# Patient Record
Sex: Female | Born: 1955 | Race: White | Hispanic: No
Health system: Southern US, Community
[De-identification: ages and names within clinical notes are randomized; demographics above are authoritative.]

## PROBLEM LIST (undated history)

## (undated) DIAGNOSIS — I10 Essential (primary) hypertension: Secondary | ICD-10-CM

## (undated) DIAGNOSIS — K219 Gastro-esophageal reflux disease without esophagitis: Secondary | ICD-10-CM

## (undated) DIAGNOSIS — E119 Type 2 diabetes mellitus without complications: Secondary | ICD-10-CM

## (undated) DIAGNOSIS — F419 Anxiety disorder, unspecified: Secondary | ICD-10-CM

## (undated) DIAGNOSIS — E785 Hyperlipidemia, unspecified: Secondary | ICD-10-CM

## (undated) HISTORY — DX: Anxiety disorder, unspecified: F41.9

## (undated) HISTORY — DX: Gastro-esophageal reflux disease without esophagitis: K21.9

## (undated) HISTORY — DX: Type 2 diabetes mellitus without complications: E11.9

## (undated) HISTORY — PX: LEG SURGERY: SHX1003

## (undated) HISTORY — DX: Essential (primary) hypertension: I10

## (undated) HISTORY — DX: Hyperlipidemia, unspecified: E78.5

## (undated) HISTORY — PX: CHOLECYSTECTOMY: SHX55

---

## 2004-02-18 ENCOUNTER — Ambulatory Visit: Payer: Self-pay | Admitting: Unknown Physician Specialty

## 2004-07-15 ENCOUNTER — Ambulatory Visit: Payer: Self-pay | Admitting: Unknown Physician Specialty

## 2004-12-13 ENCOUNTER — Ambulatory Visit: Payer: Self-pay | Admitting: Family Medicine

## 2005-12-19 ENCOUNTER — Ambulatory Visit: Payer: Self-pay | Admitting: Unknown Physician Specialty

## 2005-12-22 ENCOUNTER — Ambulatory Visit: Payer: Self-pay | Admitting: Unknown Physician Specialty

## 2006-06-11 ENCOUNTER — Ambulatory Visit: Payer: Self-pay | Admitting: General Surgery

## 2007-08-14 ENCOUNTER — Ambulatory Visit: Payer: Self-pay | Admitting: Unknown Physician Specialty

## 2007-08-26 ENCOUNTER — Ambulatory Visit: Payer: Self-pay | Admitting: Unknown Physician Specialty

## 2008-08-24 ENCOUNTER — Ambulatory Visit: Payer: Self-pay | Admitting: General Surgery

## 2009-08-31 ENCOUNTER — Ambulatory Visit: Payer: Self-pay | Admitting: General Surgery

## 2009-09-24 ENCOUNTER — Ambulatory Visit: Payer: Self-pay | Admitting: General Surgery

## 2010-06-08 ENCOUNTER — Emergency Department: Payer: Self-pay

## 2010-12-20 ENCOUNTER — Ambulatory Visit: Payer: Self-pay | Admitting: Family Medicine

## 2011-12-26 ENCOUNTER — Ambulatory Visit: Payer: Self-pay | Admitting: Family Medicine

## 2012-07-09 ENCOUNTER — Emergency Department: Payer: Self-pay | Admitting: Emergency Medicine

## 2012-07-09 LAB — COMPREHENSIVE METABOLIC PANEL
Albumin: 3.7 g/dL (ref 3.4–5.0)
Bilirubin,Total: 1.1 mg/dL — ABNORMAL HIGH (ref 0.2–1.0)
Co2: 30 mmol/L (ref 21–32)
Creatinine: 0.8 mg/dL (ref 0.60–1.30)
EGFR (African American): >60
Potassium: 2.9 mmol/L — ABNORMAL LOW (ref 3.5–5.1)
SGOT(AST): 25 U/L (ref 15–37)
Sodium: 143 mmol/L (ref 136–145)

## 2012-07-09 LAB — CBC
HCT: 40.7 % (ref 35.0–47.0)
MCH: 29.2 pg (ref 26.0–34.0)
MCHC: 34 g/dL (ref 32.0–36.0)
Platelet: 121 10*3/uL — ABNORMAL LOW (ref 150–440)
RBC: 4.73 10*6/uL (ref 3.80–5.20)
RDW: 14.3 % (ref 11.5–14.5)
WBC: 10.2 10*3/uL (ref 3.6–11.0)

## 2012-07-09 LAB — URINALYSIS, COMPLETE
Blood: NEGATIVE
Glucose,UR: NEGATIVE mg/dL (ref 0–75)
Nitrite: NEGATIVE
Protein: NEGATIVE
RBC,UR: 2 /HPF (ref 0–5)
Squamous Epithelial: 5
WBC UR: 22 /HPF (ref 0–5)

## 2012-12-31 ENCOUNTER — Ambulatory Visit: Payer: Self-pay | Admitting: Family Medicine

## 2013-02-12 ENCOUNTER — Emergency Department: Payer: Self-pay | Admitting: Emergency Medicine

## 2013-02-12 LAB — COMPREHENSIVE METABOLIC PANEL
Albumin: 4 g/dL (ref 3.4–5.0)
Bilirubin,Total: 1.4 mg/dL — ABNORMAL HIGH (ref 0.2–1.0)
Co2: 28 mmol/L (ref 21–32)
Creatinine: 0.81 mg/dL (ref 0.60–1.30)
EGFR (African American): >60
EGFR (Non-African Amer.): >60
Glucose: 156 mg/dL — ABNORMAL HIGH (ref 65–99)
Osmolality: 276 (ref 275–301)
Potassium: 3.6 mmol/L (ref 3.5–5.1)
SGOT(AST): 33 U/L (ref 15–37)

## 2013-02-12 LAB — URINALYSIS, COMPLETE
Bacteria: NEGATIVE
Bilirubin,UR: NEGATIVE
Blood: NEGATIVE
Glucose,UR: NEGATIVE mg/dL (ref 0–75)
Ketone: NEGATIVE
Protein: NEGATIVE

## 2013-02-12 LAB — CBC
HCT: 39.4 % (ref 35.0–47.0)
MCH: 29.5 pg (ref 26.0–34.0)
MCHC: 34.8 g/dL (ref 32.0–36.0)
MCV: 85 fL (ref 80–100)
Platelet: 136 10*3/uL — ABNORMAL LOW (ref 150–440)
RDW: 15.1 % — ABNORMAL HIGH (ref 11.5–14.5)
WBC: 9.5 10*3/uL (ref 3.6–11.0)

## 2013-02-12 LAB — LIPASE, BLOOD: Lipase: 129 U/L (ref 73–393)

## 2013-04-08 ENCOUNTER — Ambulatory Visit: Payer: Self-pay | Admitting: Unknown Physician Specialty

## 2013-04-08 LAB — HEPATIC FUNCTION PANEL A (ARMC)
Bilirubin, Direct: 0.3 mg/dL — ABNORMAL HIGH (ref 0.00–0.20)
Bilirubin,Total: 1.9 mg/dL — ABNORMAL HIGH (ref 0.2–1.0)
SGOT(AST): 22 U/L (ref 15–37)
SGPT (ALT): 25 U/L (ref 12–78)

## 2013-04-08 LAB — PLATELET COUNT: Platelet: 131 10*3/uL — ABNORMAL LOW (ref 150–440)

## 2013-04-09 LAB — PATHOLOGY REPORT

## 2014-01-01 ENCOUNTER — Ambulatory Visit: Payer: Self-pay | Admitting: Family Medicine

## 2014-03-09 IMAGING — CR DG CHEST 2V
1 series · 2 of 2 positions shown · non-contrast
Comparison: none

REASON FOR EXAM: epigastric pain
COMMENTS:

PROCEDURE:     DXR - DXR CHEST PA (OR AP) AND LATERAL  - February 12, 2013  [DATE]
RESULT:     The lungs are clear. The heart and pulmonary vessels are normal.
The bony and mediastinal structures are unremarkable. There is no effusion.
There is no pneumothorax or evidence of congestive failure.

[Series 1: pa · 0.17mm/px · 2 of 2 slices shown]
[im 1/2]
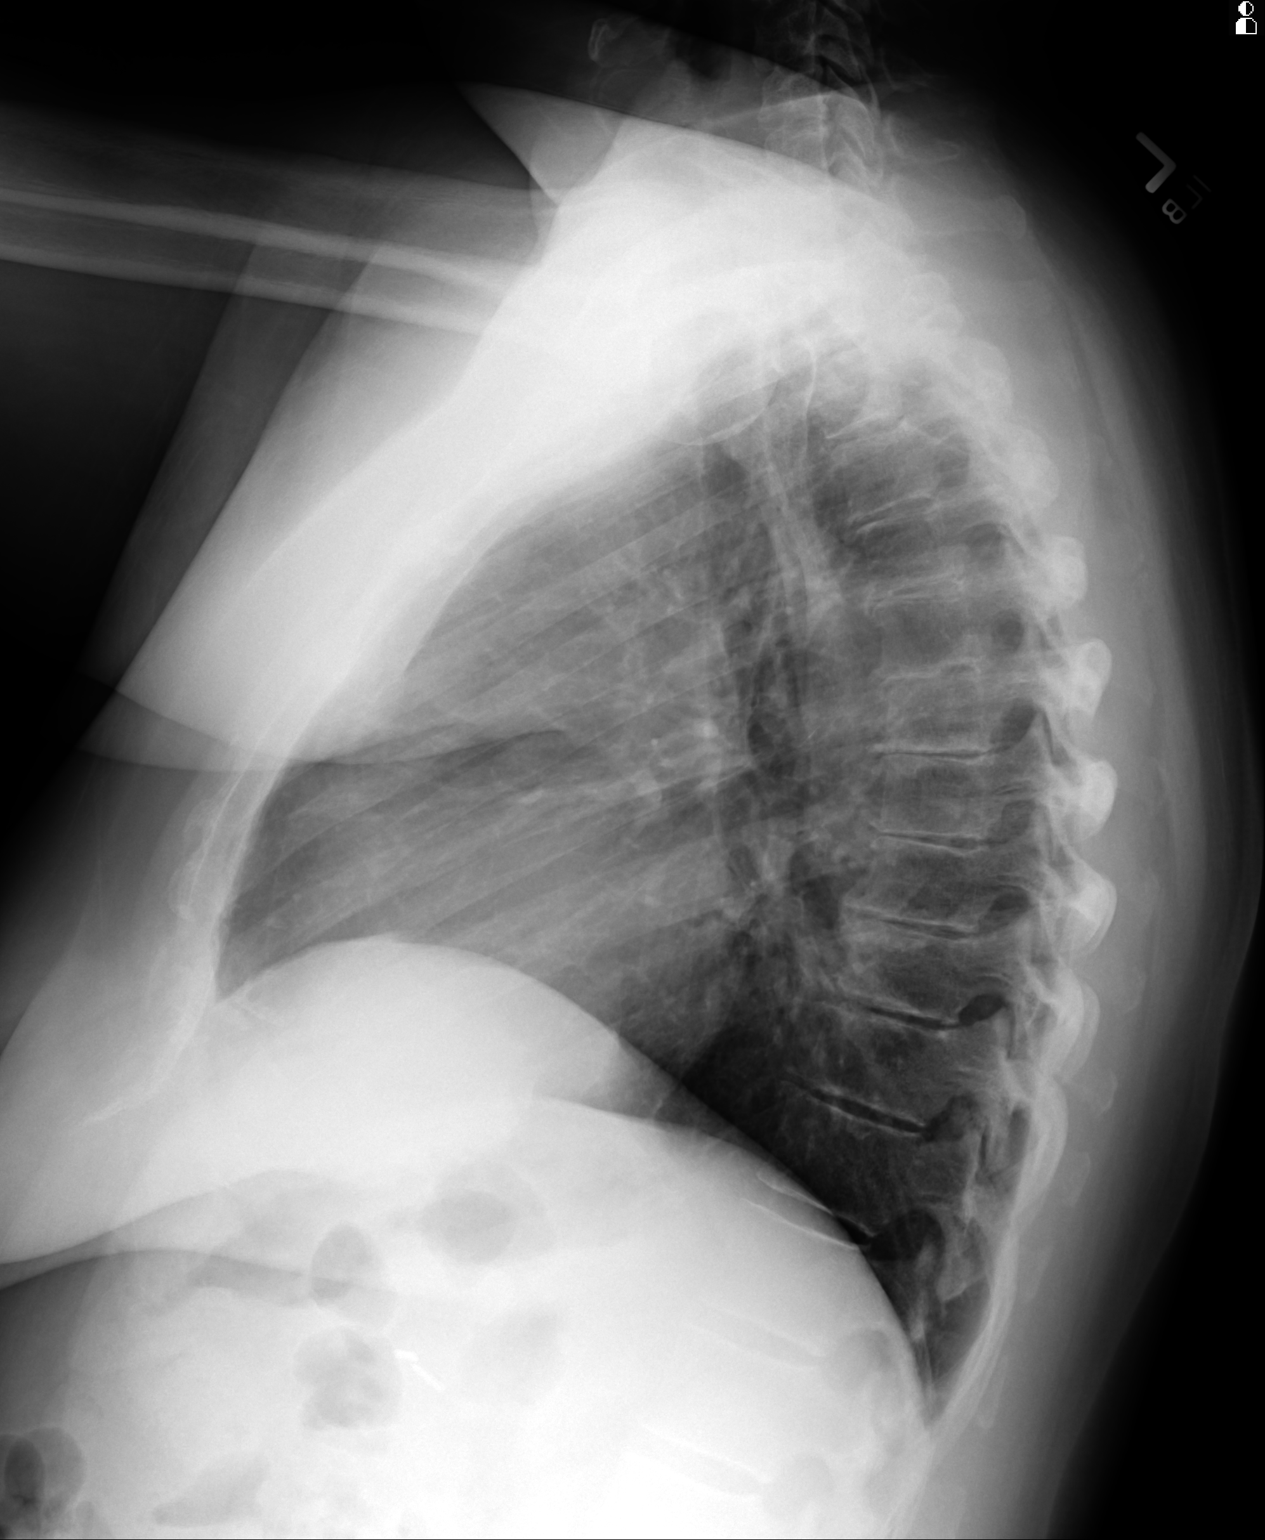
[im 2/2]
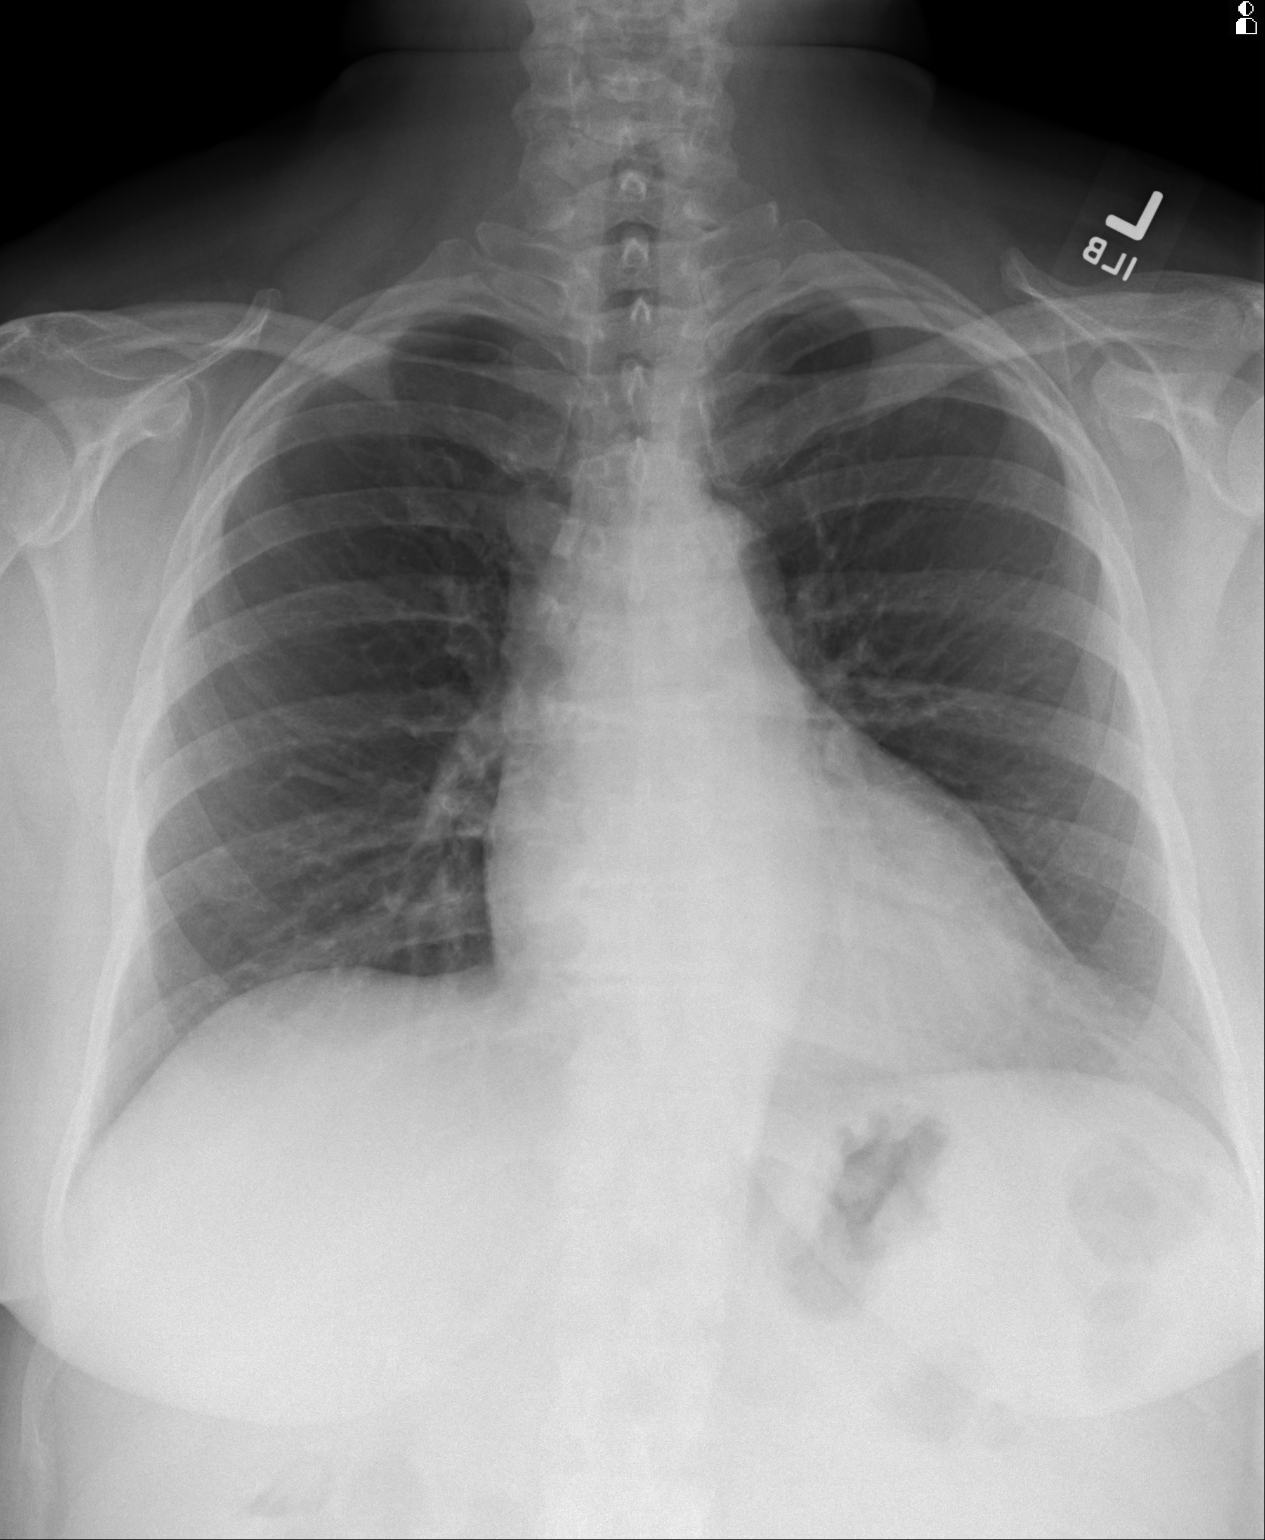

[2 of 2 positions shown; findings below may reference images not displayed]

IMPRESSION: No acute cardiopulmonary disease.

[REDACTED]

## 2015-02-10 ENCOUNTER — Other Ambulatory Visit: Payer: Self-pay | Admitting: Family Medicine

## 2015-02-10 DIAGNOSIS — Z1231 Encounter for screening mammogram for malignant neoplasm of breast: Secondary | ICD-10-CM

## 2015-02-24 ENCOUNTER — Ambulatory Visit
Admission: RE | Admit: 2015-02-24 | Discharge: 2015-02-24 | Disposition: A | Discharge status: Home / Self Care | Payer: PRIVATE HEALTH INSURANCE | Source: Ambulatory Visit | Attending: Family Medicine | Admitting: Family Medicine

## 2015-02-24 DIAGNOSIS — Z1231 Encounter for screening mammogram for malignant neoplasm of breast: Secondary | ICD-10-CM | POA: Insufficient documentation

## 2016-02-15 ENCOUNTER — Other Ambulatory Visit: Payer: Self-pay | Admitting: Family Medicine

## 2016-02-15 DIAGNOSIS — Z1231 Encounter for screening mammogram for malignant neoplasm of breast: Secondary | ICD-10-CM

## 2016-03-13 ENCOUNTER — Encounter: Payer: Self-pay | Admitting: Radiology

## 2016-03-13 ENCOUNTER — Ambulatory Visit
Admission: RE | Admit: 2016-03-13 | Discharge: 2016-03-13 | Disposition: A | Discharge status: Home / Self Care | Payer: Managed Care, Other (non HMO) | Source: Ambulatory Visit | Attending: Family Medicine | Admitting: Family Medicine

## 2016-03-13 DIAGNOSIS — Z1231 Encounter for screening mammogram for malignant neoplasm of breast: Secondary | ICD-10-CM | POA: Diagnosis not present

## 2016-04-03 ENCOUNTER — Encounter: Payer: Self-pay | Admitting: Physician Assistant

## 2016-04-03 ENCOUNTER — Ambulatory Visit: Payer: Self-pay | Admitting: Physician Assistant

## 2016-04-03 VITALS — BP 130/80 | HR 97 | Temp 98.4°F

## 2016-04-03 DIAGNOSIS — J0120 Acute ethmoidal sinusitis, unspecified: Secondary | ICD-10-CM

## 2016-04-03 MED ORDER — AMOXICILLIN 875 MG PO TABS: 875.0000 mg | ORAL_TABLET | Freq: Two times a day (BID) | ORAL | 0 refills | Status: DC

## 2016-04-03 MED ORDER — PSEUDOEPH-BROMPHEN-DM 30-2-10 MG/5ML PO SYRP: 5.0000 mL | ORAL_SOLUTION | Freq: Four times a day (QID) | ORAL | 0 refills | Status: DC | PRN

## 2016-04-03 NOTE — Progress Notes (Signed)
   Subjective:Sinus congestion    Patient ID: Karen Bond, female    DOB: Feb 27, 1956, 60 y.o.   MRN: [id number]  HPI Patient c/o one week of sinus and ear pressure. Thick greenish nasal discharge. Intermitting productive/non-productive cough. Denies fever/chills, or N/V/D. No palliative measures for compliant.   Review of Systems Negative except for compliant.    Objective:   Physical Exam Bilaterally maxillary guarding, edematous nasal turbinates, and thick nasal discharge. Neck supple, Lungs CTA, and Heart RRR       Assessment & Plan:Sinusitis  Amoxil and Bromfed DM. Follow up PRN.

## 2016-05-22 ENCOUNTER — Encounter: Payer: Managed Care, Other (non HMO) | Attending: Family Medicine | Admitting: *Deleted

## 2016-05-22 ENCOUNTER — Encounter: Payer: Self-pay | Admitting: *Deleted

## 2016-05-22 VITALS — BP 130/80 | Wt 215.9 lb

## 2016-05-22 DIAGNOSIS — E1165 Type 2 diabetes mellitus with hyperglycemia: Secondary | ICD-10-CM | POA: Insufficient documentation

## 2016-05-22 DIAGNOSIS — N183 Chronic kidney disease, stage 3 (moderate): Secondary | ICD-10-CM | POA: Diagnosis not present

## 2016-05-22 DIAGNOSIS — Z713 Dietary counseling and surveillance: Secondary | ICD-10-CM | POA: Insufficient documentation

## 2016-05-22 DIAGNOSIS — E1122 Type 2 diabetes mellitus with diabetic chronic kidney disease: Secondary | ICD-10-CM | POA: Insufficient documentation

## 2016-05-22 DIAGNOSIS — E119 Type 2 diabetes mellitus without complications: Secondary | ICD-10-CM

## 2016-05-22 NOTE — Progress Notes (Signed)
Diabetes Self-Management Education  Visit Type: First/Initial  Appt. Start Time: 0850 Appt. End Time: 1194  05/22/2016  [name], identified by name and date of birth, is a 61 y.o. female with a diagnosis of Diabetes: Type 2.   ASSESSMENT  Blood pressure 130/80, weight 215 lb 14.4 oz (97.9 kg). There is no height or weight on file to calculate BMI.      Diabetes Self-Management Education - 05/22/16 1728      Visit Information   Visit Type First/Initial     Initial Visit   Diabetes Type Type 2   Are you currently following a meal plan? Yes   What type of meal plan do you follow? no sodas, less carbs, more salads, fruit, water   Are you taking your medications as prescribed? Yes   Date Diagnosed approx 10 years ago     Health Coping   How would you rate your overall health? Good     Psychosocial Assessment   Patient Belief/Attitude about Diabetes Other (comment)  "anxious, no control"   Self-care barriers None   Self-management support Doctor's office;Family   Patient Concerns Nutrition/Meal planning;Medication;Glycemic Control;Monitoring;Weight Control;Healthy Lifestyle   Special Needs None   Preferred Learning Style Auditory;Visual   Learning Readiness Change in progress   How often do you need to have someone help you when you read instructions, pamphlets, or other written materials from your doctor or pharmacy? 1 - Never   What is the last grade level you completed in school? 12th      Pre-Education Assessment   Patient understands the diabetes disease and treatment process. Needs Instruction   Patient understands incorporating nutritional management into lifestyle. Needs Instruction   Patient undertands incorporating physical activity into lifestyle. Needs Instruction   Patient understands using medications safely. Needs Instruction   Patient understands monitoring blood glucose, interpreting and using results Needs Review   Patient understands  prevention, detection, and treatment of acute complications. Needs Instruction   Patient understands prevention, detection, and treatment of chronic complications. Needs Instruction   Patient understands how to develop strategies to address psychosocial issues. Needs Instruction   Patient understands how to develop strategies to promote health/change behavior. Needs Instruction     Complications   Last HgB A1C per patient/outside source 8.4 %  04/13/16   How often do you check your blood sugar? 3-4 times/day   Fasting Blood glucose range (mg/dL) 130-179  Pt reports FBG's 140-167 mg/dL. Pre-lunch and pre-supper less than 140's mg/dL. Bedtime 110's mg/dL   Have you had a dilated eye exam in the past 12 months? Yes   Have you had a dental exam in the past 12 months? Yes   Are you checking your feet? Yes   How many days per week are you checking your feet? 4     Dietary Intake   Breakfast apple, oatmeal   Snack (morning) fruit - apple, grapes   Lunch grilled chicken salad, crackers   Snack (afternoon) fruit - apple, grapes   Dinner salads, eggs with cheese and bacon, meat with green beans, peas, occasional corn, bread and mac-n-cheese   Beverage(s) water     Exercise   Exercise Type ADL's     Patient Education   Disease state  Definition of diabetes, type 1 and 2, and the diagnosis of diabetes;Explored patient's options for treatment of their diabetes   Nutrition management  Role of diet in the treatment of diabetes and the relationship between the three main macronutrients  and blood glucose level;Carbohydrate counting   Physical activity and exercise  Role of exercise on diabetes management, blood pressure control and cardiac health.   Medications Taught/reviewed insulin injection, site rotation, insulin storage and needle disposal.;Reviewed patients medication for diabetes, action, purpose, timing of dose and side effects.   Monitoring Taught/discussed recording of test results and  interpretation of SMBG.;Identified appropriate SMBG and/or A1C goals.;Yearly dilated eye exam   Acute complications Taught treatment of hypoglycemia - the 15 rule.   Chronic complications Relationship between chronic complications and blood glucose control   Psychosocial adjustment Role of stress on diabetes;Identified and addressed patients feelings and concerns about diabetes     Individualized Goals (developed by patient)   Reducing Risk Improve blood sugars Decrease medications Prevent diabetes complications Lose weight Lead a healthier lifestyle Become more fit     Outcomes   Expected Outcomes Demonstrated interest in learning. Expect positive outcomes   Future DMSE 2 wks      Individualized Plan for Diabetes Self-Management Training:   Learning Objective:  Patient will have a greater understanding of diabetes self-management. Patient education plan is to attend individual and/or group sessions per assessed needs and concerns.   Plan:   Patient Instructions  Check blood sugars 2 x day before breakfast and 2 hrs after supper every day or as directed by MD Bring blood sugar records to the next class Exercise: Begin walking for 10-15 minutes 3 days a week and gradually increase to 30 minutes 5 x week Eat 3 meals day, 1-2 snacks a day Space meals 4-6 hours apart Make an eye doctor appointment Carry fast acting glucose and a snack at all times Rotate injection sites Begin new insulin pen after 28 days of use   Expected Outcomes:  Demonstrated interest in learning. Expect positive outcomes  Education material provided:  General Meal Planning Guidelines Simple Meal Plan Symptoms, causes and treatments of Hypoglycemia  If problems or questions, patient to contact team via:  Johny Drilling, RN, CCM, CDE [phone]  Future DSME appointment: 2 wks  June 12, 2016 for Diabetes Class 1

## 2016-05-22 NOTE — Patient Instructions (Signed)
Check blood sugars 2 x day before breakfast and 2 hrs after supper every day or as directed by MD Bring blood sugar records to the next class  Exercise: Begin walking for 10-15 minutes 3 days a week and gradually increase to 30 minutes 5 x week  Eat 3 meals day, 1-2 snacks a day Space meals 4-6 hours apart  Make an eye doctor appointment  Carry fast acting glucose and a snack at all times Rotate injection sites Begin new insulin pen after 28 days of use  Return for classes on:

## 2016-06-12 ENCOUNTER — Encounter: Payer: Self-pay | Admitting: Dietician

## 2016-06-12 ENCOUNTER — Encounter: Payer: Managed Care, Other (non HMO) | Attending: Family Medicine | Admitting: Dietician

## 2016-06-12 VITALS — Wt 214.7 lb

## 2016-06-12 DIAGNOSIS — Z713 Dietary counseling and surveillance: Secondary | ICD-10-CM | POA: Insufficient documentation

## 2016-06-12 DIAGNOSIS — E119 Type 2 diabetes mellitus without complications: Secondary | ICD-10-CM

## 2016-06-12 DIAGNOSIS — E1165 Type 2 diabetes mellitus with hyperglycemia: Secondary | ICD-10-CM | POA: Diagnosis not present

## 2016-06-12 DIAGNOSIS — E1122 Type 2 diabetes mellitus with diabetic chronic kidney disease: Secondary | ICD-10-CM | POA: Insufficient documentation

## 2016-06-12 DIAGNOSIS — N183 Chronic kidney disease, stage 3 (moderate): Secondary | ICD-10-CM | POA: Insufficient documentation

## 2016-06-12 NOTE — Progress Notes (Signed)

## 2016-06-19 ENCOUNTER — Encounter: Payer: Self-pay | Admitting: Dietician

## 2016-06-19 ENCOUNTER — Encounter: Payer: Managed Care, Other (non HMO) | Admitting: Dietician

## 2016-06-19 VITALS — Wt 211.8 lb

## 2016-06-19 DIAGNOSIS — Z713 Dietary counseling and surveillance: Secondary | ICD-10-CM | POA: Diagnosis not present

## 2016-06-19 DIAGNOSIS — E119 Type 2 diabetes mellitus without complications: Secondary | ICD-10-CM

## 2016-06-19 NOTE — Patient Instructions (Signed)

## 2016-06-26 ENCOUNTER — Encounter: Payer: Managed Care, Other (non HMO) | Admitting: Dietician

## 2016-06-26 ENCOUNTER — Encounter: Payer: Self-pay | Admitting: Dietician

## 2016-06-26 VITALS — BP 130/80 | Wt 213.5 lb

## 2016-06-26 DIAGNOSIS — E119 Type 2 diabetes mellitus without complications: Secondary | ICD-10-CM

## 2016-06-26 DIAGNOSIS — Z713 Dietary counseling and surveillance: Secondary | ICD-10-CM | POA: Diagnosis not present

## 2016-07-07 ENCOUNTER — Encounter: Payer: Self-pay | Admitting: *Deleted

## 2017-02-23 ENCOUNTER — Other Ambulatory Visit: Payer: Self-pay | Admitting: Family Medicine

## 2017-02-23 DIAGNOSIS — Z1231 Encounter for screening mammogram for malignant neoplasm of breast: Secondary | ICD-10-CM

## 2017-03-27 ENCOUNTER — Ambulatory Visit: Payer: Self-pay | Admitting: Physician Assistant

## 2017-03-27 ENCOUNTER — Encounter: Payer: Self-pay | Admitting: Physician Assistant

## 2017-03-27 VITALS — BP 129/60 | HR 70 | Temp 98.2°F | Resp 16

## 2017-03-27 DIAGNOSIS — J0100 Acute maxillary sinusitis, unspecified: Secondary | ICD-10-CM

## 2017-03-27 DIAGNOSIS — H00012 Hordeolum externum right lower eyelid: Secondary | ICD-10-CM

## 2017-03-27 MED ORDER — AMOXICILLIN 875 MG PO TABS: 875.0000 mg | ORAL_TABLET | Freq: Two times a day (BID) | ORAL | 0 refills | Status: AC

## 2017-03-27 MED ORDER — FLUCONAZOLE 150 MG PO TABS: ORAL_TABLET | ORAL | 0 refills | Status: AC

## 2017-03-27 NOTE — Progress Notes (Signed)
S: Complains of a red swollen area on the right lower lid, some sinus pressure, mucous is yellow-green, no cough, fever, chills, vomiting diarrhea; use over-the-counter Flonase without relief, but I complaint has been for 2 days, the area got a lot larger this morning  O: Vitals are normal, right lower lid is red and swollen with a small stye, TMs are clear, nasal mucosa is bright red and swollen on the left side, throat is normal, neck is supple, no lymphadenopathy noted, lungs are clear to auscultation, heart sounds are normal  A: Stye, acute sinusitis  P: Amoxil 875 twice a day for 10 days, Diflucan 150 mg one now and one in 1 week, warm compresses to the right eye

## 2017-04-02 ENCOUNTER — Ambulatory Visit
Admission: RE | Admit: 2017-04-02 | Discharge: 2017-04-02 | Disposition: A | Discharge status: Home / Self Care | Payer: Managed Care, Other (non HMO) | Source: Ambulatory Visit | Attending: Family Medicine | Admitting: Family Medicine

## 2017-04-02 DIAGNOSIS — Z1231 Encounter for screening mammogram for malignant neoplasm of breast: Secondary | ICD-10-CM | POA: Insufficient documentation

## 2018-03-19 ENCOUNTER — Other Ambulatory Visit: Payer: Self-pay | Admitting: Family Medicine

## 2018-03-19 DIAGNOSIS — Z1231 Encounter for screening mammogram for malignant neoplasm of breast: Secondary | ICD-10-CM

## 2018-05-20 ENCOUNTER — Ambulatory Visit
Admission: RE | Admit: 2018-05-20 | Discharge: 2018-05-20 | Disposition: A | Discharge status: Home / Self Care | Payer: Managed Care, Other (non HMO) | Source: Ambulatory Visit | Attending: Family Medicine | Admitting: Family Medicine

## 2018-05-20 DIAGNOSIS — Z1231 Encounter for screening mammogram for malignant neoplasm of breast: Secondary | ICD-10-CM | POA: Diagnosis not present

## 2019-06-10 ENCOUNTER — Other Ambulatory Visit: Payer: Self-pay | Admitting: Family Medicine

## 2019-06-10 DIAGNOSIS — Z1231 Encounter for screening mammogram for malignant neoplasm of breast: Secondary | ICD-10-CM

## 2019-07-04 ENCOUNTER — Ambulatory Visit
Admission: RE | Admit: 2019-07-04 | Discharge: 2019-07-04 | Disposition: A | Discharge status: Home / Self Care | Payer: Managed Care, Other (non HMO) | Source: Ambulatory Visit | Attending: Family Medicine | Admitting: Family Medicine

## 2019-07-04 DIAGNOSIS — Z1231 Encounter for screening mammogram for malignant neoplasm of breast: Secondary | ICD-10-CM | POA: Diagnosis not present

## 2019-07-08 ENCOUNTER — Other Ambulatory Visit: Payer: Self-pay | Admitting: Family Medicine

## 2019-07-08 DIAGNOSIS — N6320 Unspecified lump in the left breast, unspecified quadrant: Secondary | ICD-10-CM

## 2019-07-08 DIAGNOSIS — R928 Other abnormal and inconclusive findings on diagnostic imaging of breast: Secondary | ICD-10-CM

## 2019-07-11 ENCOUNTER — Ambulatory Visit
Admission: RE | Admit: 2019-07-11 | Discharge: 2019-07-11 | Disposition: A | Discharge status: Home / Self Care | Payer: Managed Care, Other (non HMO) | Source: Ambulatory Visit | Attending: Family Medicine | Admitting: Family Medicine

## 2019-07-11 ENCOUNTER — Other Ambulatory Visit: Payer: Self-pay

## 2019-07-11 DIAGNOSIS — N6320 Unspecified lump in the left breast, unspecified quadrant: Secondary | ICD-10-CM

## 2019-07-11 DIAGNOSIS — R928 Other abnormal and inconclusive findings on diagnostic imaging of breast: Secondary | ICD-10-CM

## 2021-01-03 ENCOUNTER — Other Ambulatory Visit: Payer: Self-pay | Admitting: Family Medicine

## 2021-01-03 DIAGNOSIS — Z1231 Encounter for screening mammogram for malignant neoplasm of breast: Secondary | ICD-10-CM

## 2021-01-27 ENCOUNTER — Ambulatory Visit
Admission: RE | Admit: 2021-01-27 | Discharge: 2021-01-27 | Disposition: A | Discharge status: Home / Self Care | Payer: Managed Care, Other (non HMO) | Source: Ambulatory Visit | Attending: Family Medicine | Admitting: Family Medicine

## 2021-01-27 ENCOUNTER — Other Ambulatory Visit: Payer: Self-pay

## 2021-01-27 DIAGNOSIS — Z1231 Encounter for screening mammogram for malignant neoplasm of breast: Secondary | ICD-10-CM | POA: Diagnosis not present

## 2021-12-19 ENCOUNTER — Other Ambulatory Visit: Payer: Self-pay | Admitting: Family Medicine

## 2021-12-19 DIAGNOSIS — Z1231 Encounter for screening mammogram for malignant neoplasm of breast: Secondary | ICD-10-CM

## 2021-12-19 DIAGNOSIS — Z780 Asymptomatic menopausal state: Secondary | ICD-10-CM

## 2022-01-31 ENCOUNTER — Other Ambulatory Visit: Payer: Managed Care, Other (non HMO)

## 2022-02-21 IMAGING — MG MM DIGITAL SCREENING BILAT W/ TOMO AND CAD
8 series · 8 of 24 positions shown · non-contrast
Comparison: Previous exam(s).

CLINICAL DATA: Screening.

EXAM:
DIGITAL SCREENING BILATERAL MAMMOGRAM WITH TOMOSYNTHESIS AND CAD
TECHNIQUE: Bilateral screening digital craniocaudal and mediolateral oblique
mammograms were obtained. Bilateral screening digital breast
tomosynthesis was performed. The images were evaluated with
computer-aided detection.

[R CC synth-2D]
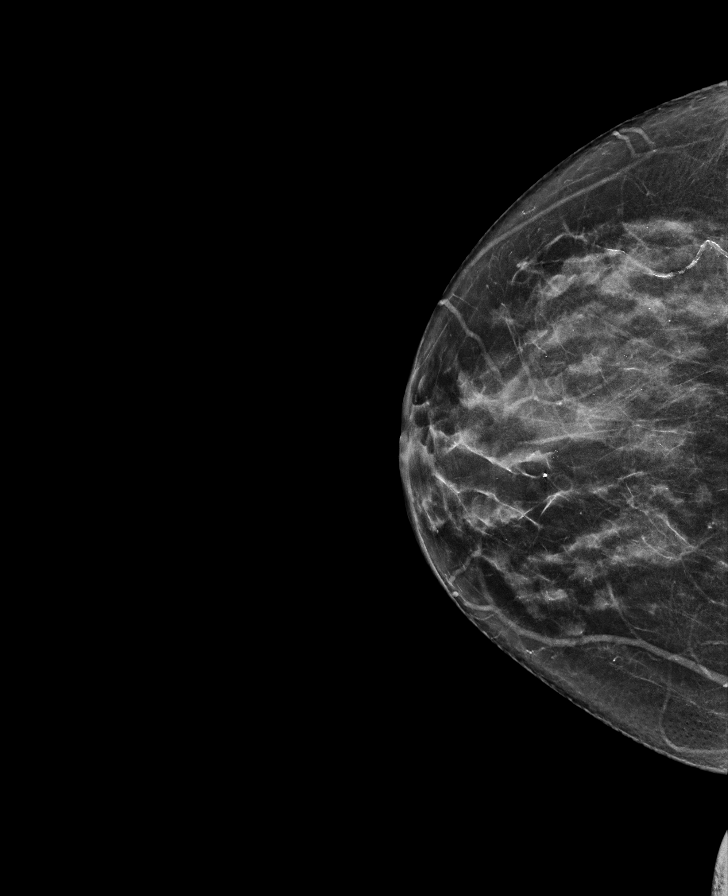

[R MLO synth-2D]
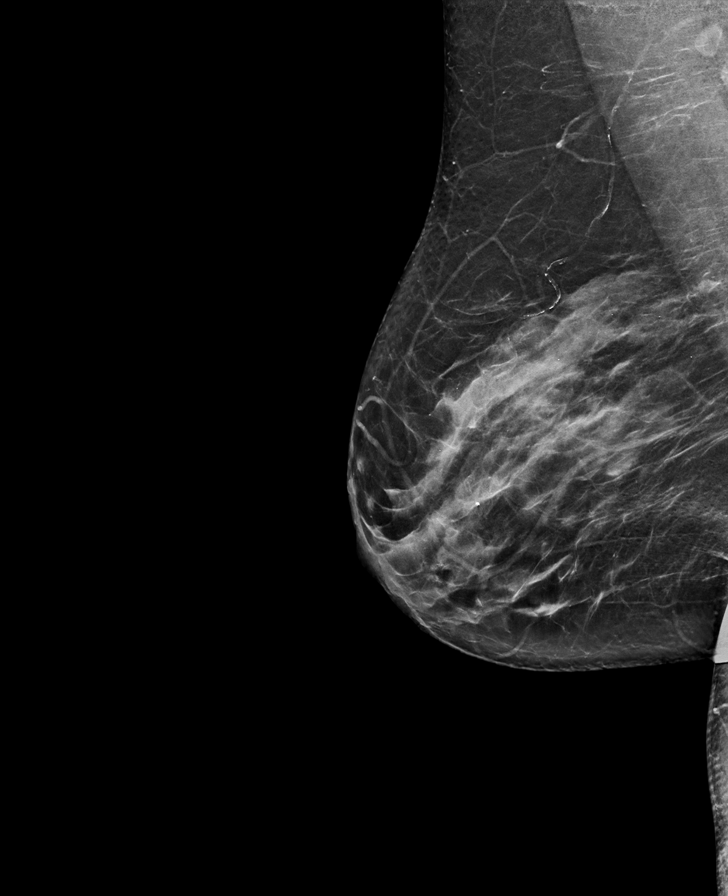

[L MLO synth-2D]
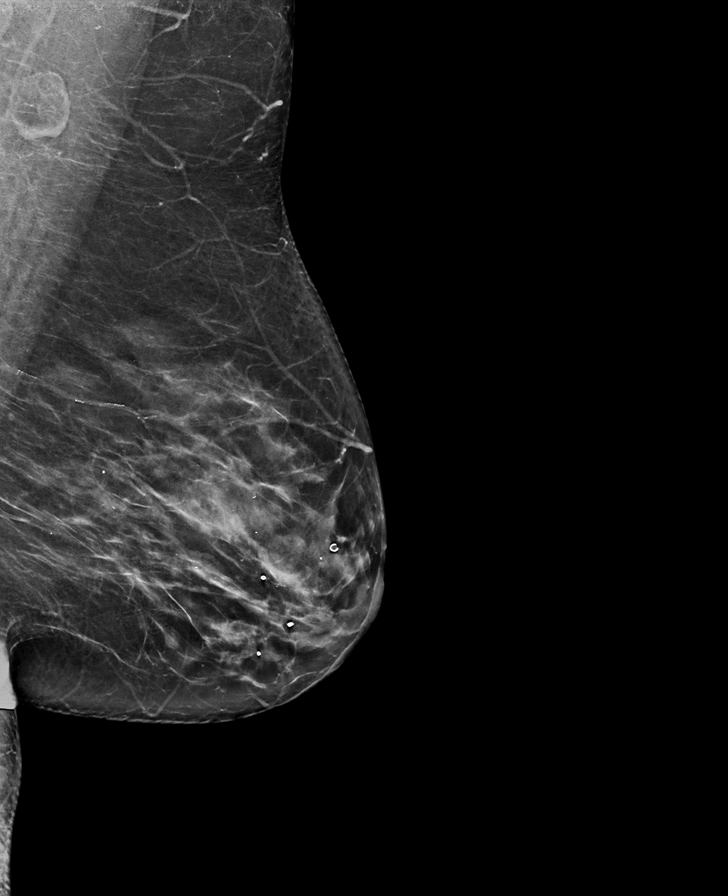

[L CC synth-2D]
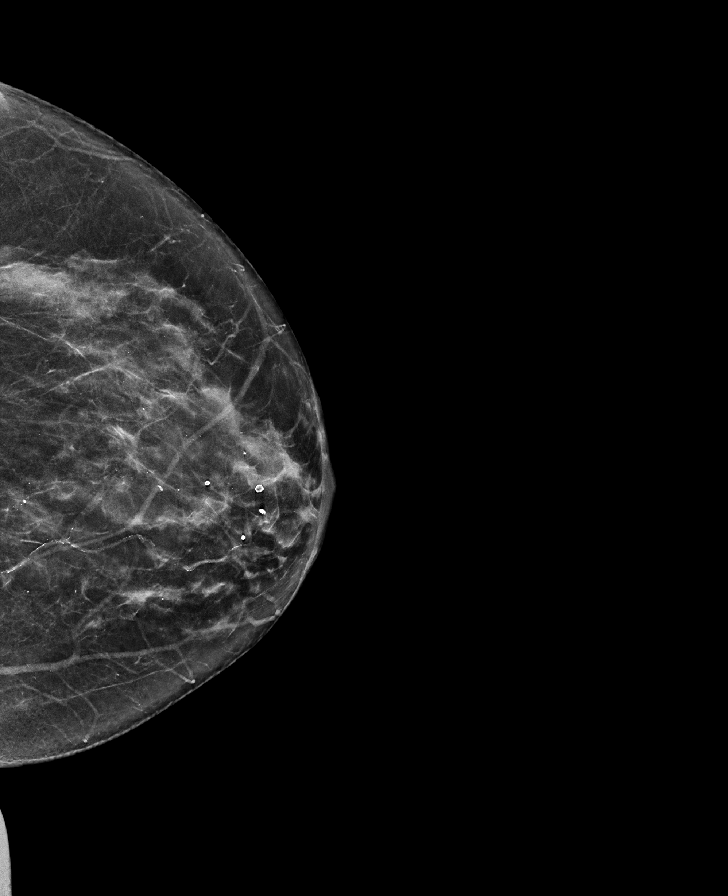

[R MLO tomo · tomo slice 35/70.0]
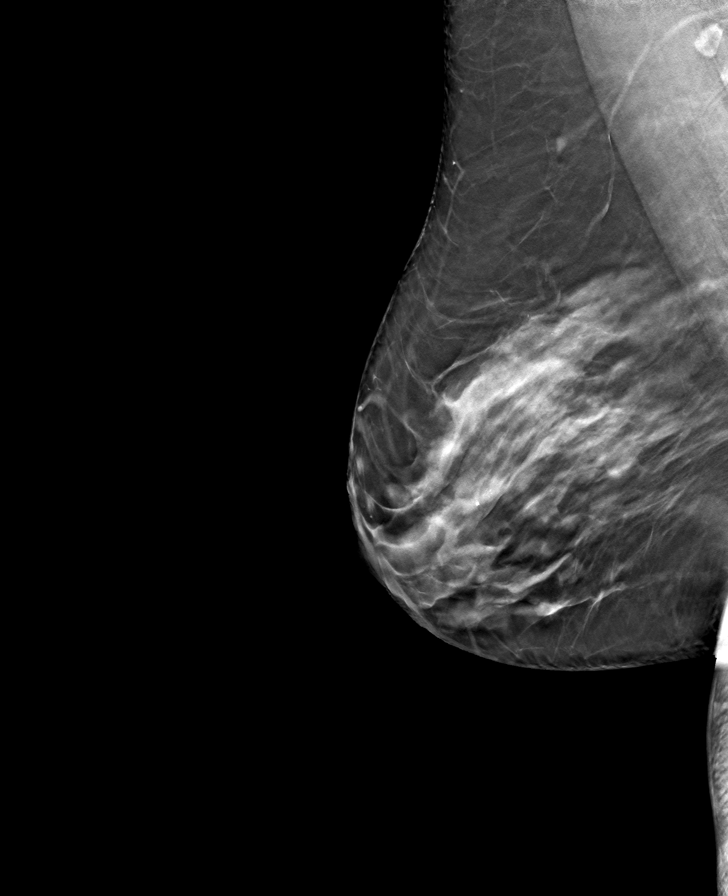

[L MLO tomo · tomo slice 37/72.0]
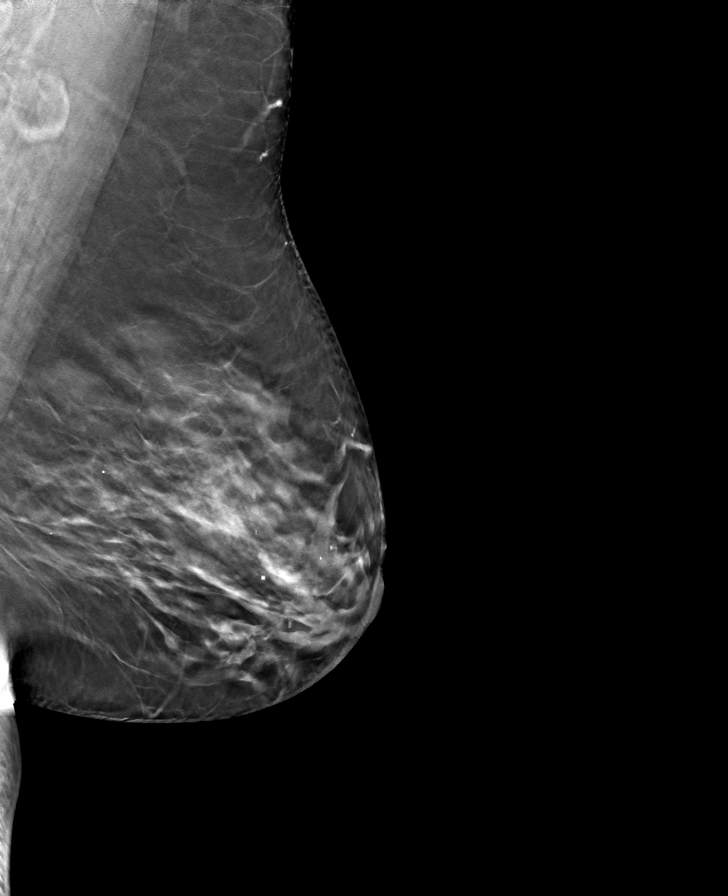

[R CC tomo · tomo slice 31/60.0]
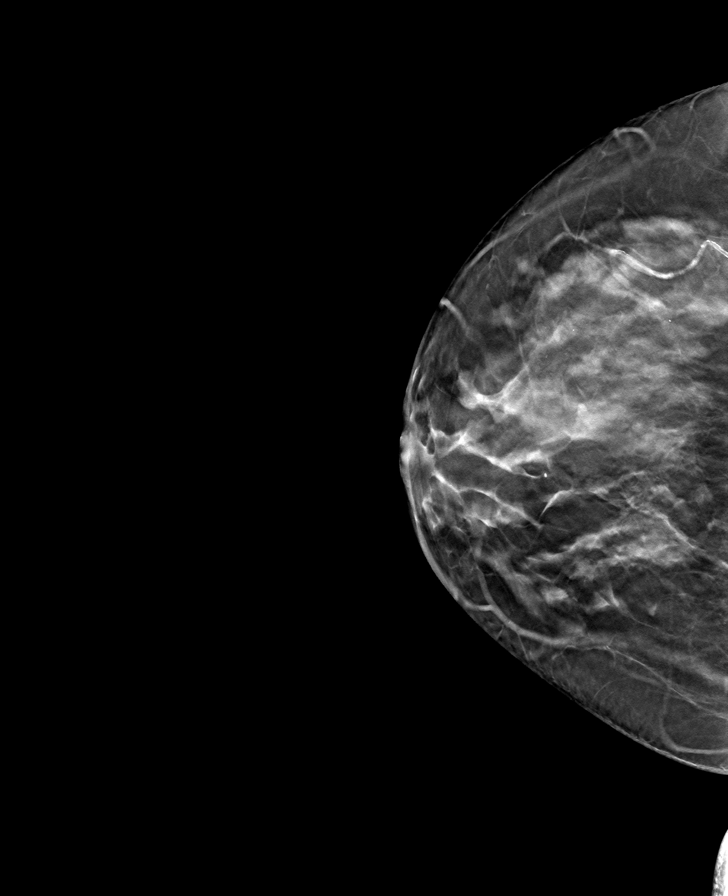

[L CC tomo · tomo slice 31/62.0]
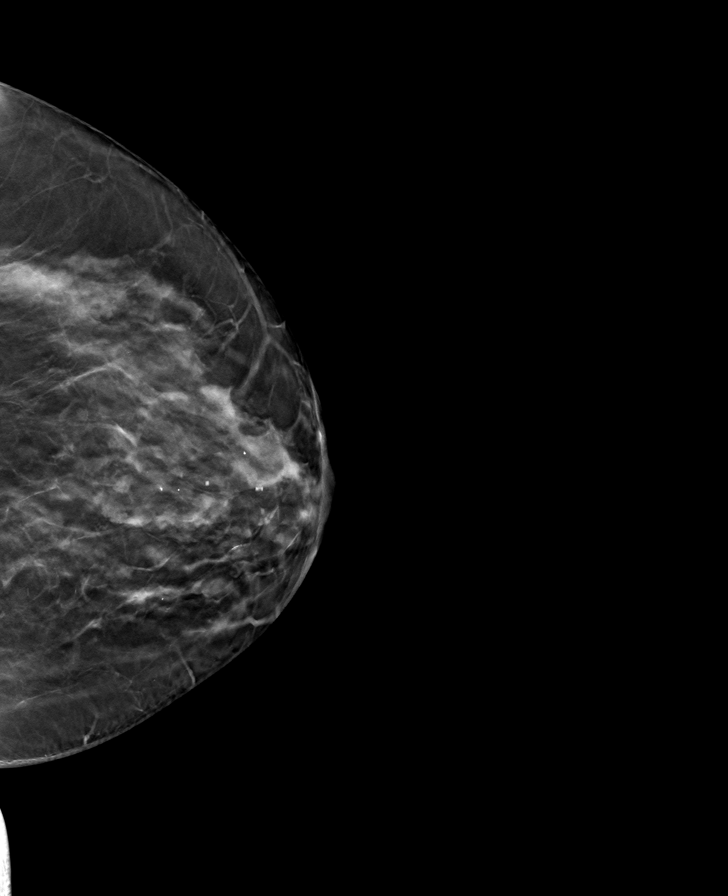

[8 of 24 positions shown; findings below may reference images not displayed]

ACR Breast Density Category c: The breast tissue is heterogeneously
dense, which may obscure small masses.
FINDINGS: There are no findings suspicious for malignancy.
IMPRESSION: No mammographic evidence of malignancy. A result letter of this
screening mammogram will be mailed directly to the patient.

RECOMMENDATION:
Screening mammogram in one year. (Code:Q3-W-BC3)

BI-RADS CATEGORY  1: Negative.

## 2022-02-28 ENCOUNTER — Ambulatory Visit
Admission: RE | Admit: 2022-02-28 | Discharge: 2022-02-28 | Disposition: A | Discharge status: Home / Self Care | Payer: Managed Care, Other (non HMO) | Source: Ambulatory Visit | Attending: Family Medicine | Admitting: Family Medicine

## 2022-02-28 DIAGNOSIS — Z1231 Encounter for screening mammogram for malignant neoplasm of breast: Secondary | ICD-10-CM

## 2022-02-28 DIAGNOSIS — Z780 Asymptomatic menopausal state: Secondary | ICD-10-CM | POA: Insufficient documentation

## 2022-03-02 ENCOUNTER — Other Ambulatory Visit: Payer: Self-pay | Admitting: Family Medicine

## 2022-03-02 DIAGNOSIS — N6489 Other specified disorders of breast: Secondary | ICD-10-CM

## 2022-03-02 DIAGNOSIS — R928 Other abnormal and inconclusive findings on diagnostic imaging of breast: Secondary | ICD-10-CM

## 2022-03-09 ENCOUNTER — Ambulatory Visit
Admission: RE | Admit: 2022-03-09 | Discharge: 2022-03-09 | Disposition: A | Discharge status: Home / Self Care | Payer: Managed Care, Other (non HMO) | Source: Ambulatory Visit | Attending: Family Medicine | Admitting: Family Medicine

## 2022-03-09 DIAGNOSIS — R928 Other abnormal and inconclusive findings on diagnostic imaging of breast: Secondary | ICD-10-CM | POA: Diagnosis present

## 2022-03-09 DIAGNOSIS — N6489 Other specified disorders of breast: Secondary | ICD-10-CM | POA: Diagnosis present

## 2023-03-21 ENCOUNTER — Other Ambulatory Visit: Payer: Self-pay | Admitting: Family Medicine

## 2023-03-21 DIAGNOSIS — Z1231 Encounter for screening mammogram for malignant neoplasm of breast: Secondary | ICD-10-CM

## 2023-04-25 ENCOUNTER — Ambulatory Visit
Admission: RE | Admit: 2023-04-25 | Discharge: 2023-04-25 | Disposition: A | Discharge status: Home / Self Care | Payer: Managed Care, Other (non HMO) | Source: Ambulatory Visit | Attending: Family Medicine | Admitting: Family Medicine

## 2023-04-25 DIAGNOSIS — Z1231 Encounter for screening mammogram for malignant neoplasm of breast: Secondary | ICD-10-CM | POA: Insufficient documentation

## 2024-04-17 ENCOUNTER — Other Ambulatory Visit: Payer: Self-pay | Admitting: Family Medicine

## 2024-04-17 DIAGNOSIS — Z1231 Encounter for screening mammogram for malignant neoplasm of breast: Secondary | ICD-10-CM

## 2024-05-20 ENCOUNTER — Ambulatory Visit
Admission: RE | Admit: 2024-05-20 | Discharge: 2024-05-20 | Disposition: A | Discharge status: Home / Self Care | Source: Ambulatory Visit | Attending: Family Medicine | Admitting: Family Medicine

## 2024-05-20 DIAGNOSIS — Z1231 Encounter for screening mammogram for malignant neoplasm of breast: Secondary | ICD-10-CM | POA: Diagnosis present

## 2024-05-28 ENCOUNTER — Other Ambulatory Visit: Payer: Self-pay | Admitting: Family Medicine

## 2024-05-28 DIAGNOSIS — R928 Other abnormal and inconclusive findings on diagnostic imaging of breast: Secondary | ICD-10-CM

## 2024-05-29 ENCOUNTER — Ambulatory Visit
Admission: RE | Admit: 2024-05-29 | Discharge: 2024-05-29 | Disposition: A | Discharge status: Home / Self Care | Source: Ambulatory Visit | Attending: Family Medicine | Admitting: Family Medicine

## 2024-05-29 DIAGNOSIS — R928 Other abnormal and inconclusive findings on diagnostic imaging of breast: Secondary | ICD-10-CM | POA: Insufficient documentation
